# Patient Record
Sex: Male | Born: 1995 | Race: Black or African American | Hispanic: No | Marital: Single | Smoking: Never smoker
Health system: Southern US, Community
[De-identification: ages and names within clinical notes are randomized; demographics above are authoritative.]

## PROBLEM LIST (undated history)

## (undated) DIAGNOSIS — J45909 Unspecified asthma, uncomplicated: Secondary | ICD-10-CM

## (undated) HISTORY — PX: TYMPANOSTOMY TUBE PLACEMENT: SHX32

---

## 2019-10-20 ENCOUNTER — Other Ambulatory Visit: Payer: Self-pay

## 2019-10-20 ENCOUNTER — Emergency Department (HOSPITAL_COMMUNITY): Payer: Self-pay

## 2019-10-20 ENCOUNTER — Encounter (HOSPITAL_COMMUNITY): Payer: Self-pay | Admitting: Emergency Medicine

## 2019-10-20 DIAGNOSIS — F17210 Nicotine dependence, cigarettes, uncomplicated: Secondary | ICD-10-CM | POA: Insufficient documentation

## 2019-10-20 DIAGNOSIS — R072 Precordial pain: Secondary | ICD-10-CM | POA: Insufficient documentation

## 2019-10-20 LAB — TROPONIN I (HIGH SENSITIVITY): Troponin I (High Sensitivity): 2 ng/L (ref <18)

## 2019-10-20 LAB — BASIC METABOLIC PANEL
Anion gap: 9 (ref 5–15)
BUN: 12 mg/dL (ref 6–20)
CO2: 28 mmol/L (ref 22–32)
Calcium: 9.9 mg/dL (ref 8.9–10.3)
Chloride: 102 mmol/L (ref 98–111)
Creatinine, Ser: 1.14 mg/dL (ref 0.61–1.24)
GFR calc Af Amer: >60 mL/min (ref >60)
GFR calc non Af Amer: >60 mL/min (ref >60)
Glucose, Bld: 92 mg/dL (ref 70–99)
Potassium: 3.9 mmol/L (ref 3.5–5.1)
Sodium: 139 mmol/L (ref 135–145)

## 2019-10-20 LAB — CBC
HCT: 48.3 % (ref 39.0–52.0)
Hemoglobin: 16.4 g/dL (ref 13.0–17.0)
MCH: 30.7 pg (ref 26.0–34.0)
MCHC: 34 g/dL (ref 30.0–36.0)
MCV: 90.4 fL (ref 80.0–100.0)
Platelets: 230 10*3/uL (ref 150–400)
RBC: 5.34 MIL/uL (ref 4.22–5.81)
RDW: 11.3 % — ABNORMAL LOW (ref 11.5–15.5)
WBC: 6.1 10*3/uL (ref 4.0–10.5)
nRBC: 0 % (ref 0.0–0.2)

## 2019-10-20 MED ORDER — SODIUM CHLORIDE 0.9% FLUSH: 3.0000 mL | Freq: Once | INTRAVENOUS | Status: DC

## 2019-10-20 NOTE — ED Triage Notes (Signed)
Pt c/o rapid heart started at 6am. Pt felt pain in the center of his chest

## 2019-10-21 ENCOUNTER — Emergency Department (HOSPITAL_COMMUNITY)
Admission: EM | Admit: 2019-10-21 | Discharge: 2019-10-21 | Disposition: A | Discharge status: Home / Self Care | Payer: Self-pay | Attending: Emergency Medicine | Admitting: Emergency Medicine

## 2019-10-21 ENCOUNTER — Encounter (HOSPITAL_COMMUNITY): Payer: Self-pay | Admitting: Emergency Medicine

## 2019-10-21 DIAGNOSIS — R072 Precordial pain: Secondary | ICD-10-CM

## 2019-10-21 MED ORDER — IBUPROFEN 800 MG PO TABS
800.0000 mg | ORAL_TABLET | Freq: Once | ORAL | Status: AC
  Administered 2019-10-21: 800 mg via ORAL
  Filled 2019-10-21: qty 1

## 2019-10-21 MED ORDER — ACETAMINOPHEN 500 MG PO TABS
1000.0000 mg | ORAL_TABLET | Freq: Once | ORAL | Status: AC
  Administered 2019-10-21: 1000 mg via ORAL
  Filled 2019-10-21: qty 2

## 2019-10-21 NOTE — ED Provider Notes (Signed)
[facility]-EMERGENCY DEPT Provider Note   CSN: [id number] Arrival date & time: 10/20/19  2119     History Chief Complaint  Patient presents with  . Tachycardia    Austin Bishop is a 24 y.o. male.  The history is provided by the patient.  Chest Pain Pain location:  Substernal area Pain quality: dull   Pain radiates to:  Does not radiate Pain severity:  Mild Onset quality:  Gradual Duration:  1 day Timing:  Constant Progression:  Unchanged Chronicity:  New Context: at rest   Relieved by:  Nothing Worsened by:  Nothing Ineffective treatments:  None tried Associated symptoms: no abdominal pain, no AICD problem, no altered mental status, no anorexia, no anxiety, no back pain, no claudication, no cough, no diaphoresis, no dizziness, no dysphagia, no fatigue, no fever, no headache, no heartburn, no lower extremity edema, no nausea, no near-syncope, no numbness, no orthopnea, no palpitations, no PND, no shortness of breath, no syncope, no vomiting and no weakness   Associated symptoms comment:  Heart was beating hard  Risk factors: male sex   Risk factors: no aortic disease   Patient admits to doing shots and smoking a pack a day and frequent marijuana use.  No leg pain no leg swelling. No travel.  No exertional symptoms.  No cough,  No f/c/r.       History reviewed. No pertinent past medical history.  There are no problems to display for this patient.   History reviewed. No pertinent surgical history.     History reviewed. No pertinent family history.  Social History   Tobacco Use  . Smoking status: Never Smoker  . Smokeless tobacco: Never Used  Substance Use Topics  . Alcohol use: Never  . Drug use: Never    Home Medications Prior to Admission medications   Not on File    Allergies    Patient has no known allergies.  Review of Systems   Review of Systems  Constitutional: Negative for diaphoresis, fatigue and fever.  HENT: Negative  for congestion and trouble swallowing.   Eyes: Negative for visual disturbance.  Respiratory: Negative for cough and shortness of breath.   Cardiovascular: Positive for chest pain. Negative for palpitations, orthopnea, claudication, leg swelling, syncope, PND and near-syncope.  Gastrointestinal: Negative for abdominal pain, anorexia, heartburn, nausea and vomiting.  Genitourinary: Negative for difficulty urinating.  Musculoskeletal: Negative for back pain.  Skin: Negative for rash.  Neurological: Negative for dizziness, weakness, numbness and headaches.  Psychiatric/Behavioral: Negative for agitation.  All other systems reviewed and are negative.   Physical Exam Updated Vital Signs BP 110/61   Pulse 69   Temp 98.1 F (36.7 C) (Oral)   Resp 20   Ht 5\' 11"  (1.803 m)   Wt 72.6 kg   SpO2 99%   BMI 22.32 kg/m   Physical Exam Vitals and nursing note reviewed.  Constitutional:      General: He is not in acute distress.    Appearance: Normal appearance.  HENT:     Head: Normocephalic and atraumatic.     Nose: Nose normal.  Eyes:     Conjunctiva/sclera: Conjunctivae normal.     Pupils: Pupils are equal, round, and reactive to light.  Cardiovascular:     Rate and Rhythm: Normal rate and regular rhythm.     Pulses: Normal pulses.     Heart sounds: Normal heart sounds.  Pulmonary:     Effort: Pulmonary effort is normal.  Breath sounds: Normal breath sounds.  Abdominal:     General: Abdomen is flat. Bowel sounds are normal.     Tenderness: There is no abdominal tenderness. There is no guarding.  Musculoskeletal:        General: No tenderness. Normal range of motion.     Cervical back: Normal range of motion and neck supple.     Comments: No calf tenderness  Skin:    General: Skin is warm and dry.     Capillary Refill: Capillary refill takes less than 2 seconds.  Neurological:     General: No focal deficit present.     Mental Status: He is alert and oriented to person,  place, and time.  Psychiatric:        Mood and Affect: Mood normal.        Behavior: Behavior normal.     ED Results / Procedures / Treatments   Labs (all labs ordered are listed, but only abnormal results are displayed) Results for orders placed or performed during the hospital encounter of 44/01/02  Basic metabolic panel  Result Value Ref Range   Sodium 139 135 - 145 mmol/L   Potassium 3.9 3.5 - 5.1 mmol/L   Chloride 102 98 - 111 mmol/L   CO2 28 22 - 32 mmol/L   Glucose, Bld 92 70 - 99 mg/dL   BUN 12 6 - 20 mg/dL   Creatinine, Ser 1.14 0.61 - 1.24 mg/dL   Calcium 9.9 8.9 - 10.3 mg/dL   GFR calc non Af Amer >60 >60 mL/min   GFR calc Af Amer >60 >60 mL/min   Anion gap 9 5 - 15  CBC  Result Value Ref Range   WBC 6.1 4.0 - 10.5 K/uL   RBC 5.34 4.22 - 5.81 MIL/uL   Hemoglobin 16.4 13.0 - 17.0 g/dL   HCT 48.3 39.0 - 52.0 %   MCV 90.4 80.0 - 100.0 fL   MCH 30.7 26.0 - 34.0 pg   MCHC 34.0 30.0 - 36.0 g/dL   RDW 11.3 (L) 11.5 - 15.5 %   Platelets 230 150 - 400 K/uL   nRBC 0.0 0.0 - 0.2 %  Troponin I (High Sensitivity)  Result Value Ref Range   Troponin I (High Sensitivity) 2 <18 ng/L   DG Chest 2 View  Result Date: 10/20/2019 CLINICAL DATA:  Tachycardia and chest pain EXAM: CHEST - 2 VIEW COMPARISON:  None. FINDINGS: The heart size and mediastinal contours are within normal limits. Both lungs are clear. The visualized skeletal structures are unremarkable. IMPRESSION: No active cardiopulmonary disease. Electronically Signed   By: Inez Catalina M.D.   On: 10/20/2019 21:55    EKG EKG Interpretation  Date/Time:  Wednesday Bettie Swavely 28 2021 21:45:48 EDT Ventricular Rate:  84 PR Interval:    QRS Duration: 81 QT Interval:  341 QTC Calculation: 403 R Axis:   85 Text Interpretation: Sinus rhythm Right atrial enlargement Confirmed by Dory Horn) on 10/21/2019 12:45:13 AM   Radiology DG Chest 2 View  Result Date: 10/20/2019 CLINICAL DATA:  Tachycardia and chest pain  EXAM: CHEST - 2 VIEW COMPARISON:  None. FINDINGS: The heart size and mediastinal contours are within normal limits. Both lungs are clear. The visualized skeletal structures are unremarkable. IMPRESSION: No active cardiopulmonary disease. Electronically Signed   By: Inez Catalina M.D.   On: 10/20/2019 21:55    Procedures Procedures (including critical care time)  Medications Ordered in ED Medications  sodium chloride flush (NS) 0.9 %  injection 3 mL (has no administration in time range)  acetaminophen (TYLENOL) tablet 1,000 mg (1,000 mg Oral Given 10/21/19 0119)  ibuprofen (ADVIL) tablet 800 mg (800 mg Oral Given 10/21/19 0120)    ED Course  I have reviewed the triage vital signs and the nursing notes.  Pertinent labs & imaging results that were available during my care of the patient were reviewed by me and considered in my medical decision making (see chart for details).   PERC negative wells 0 highly doubt PE in this low risk patient.  Ruled out for MI based on time course.  I have counseled the patient on smoking cessation as well as alcohol and marijuana sensation.  I have encour  Tyrail Grandfield was evaluated in Emergency Department on 10/21/2019 for the symptoms described in the history of present illness. He was evaluated in the context of the global COVID-19 pandemic, which necessitated consideration that the patient might be at risk for infection with the SARS-CoV-2 virus that causes COVID-19. Institutional protocols and algorithms that pertain to the evaluation of patients at risk for COVID-19 are in a state of rapid change based on information released by regulatory bodies including the CDC and federal and state organizations. These policies and algorithms were followed during the patient's care in the ED.  Final Clinical Impression(s) / ED Diagnoses Return for weakness, numbness, changes in vision or speech, fevers >100.4 unrelieved by medication, shortness of breath, intractable  vomiting, or diarrhea, abdominal pain, Inability to tolerate liquids or food, cough, altered mental status or any concerns. No signs of systemic illness or infection. The patient is nontoxic-appearing on exam and vital signs are within normal limits.   I have reviewed the triage vital signs and the nursing notes. Pertinent labs &imaging results that were available during my care of the patient were reviewed by me and considered in my medical decision making (see chart for details).  After history, exam, and medical workup I feel the patient has been appropriately medically screened and is safe for discharge home. Pertinent diagnoses were discussed with the patient. Patient was givenstrictreturn precautions.   Yousra Ivens, MD 10/21/19 8299

## 2020-10-23 ENCOUNTER — Inpatient Hospital Stay (HOSPITAL_COMMUNITY)
Admission: EM | Admit: 2020-10-23 | Discharge: 2020-10-24 | DRG: 956 | Disposition: A | Discharge status: Home / Self Care | Payer: POS | Attending: General Surgery | Admitting: General Surgery

## 2020-10-23 ENCOUNTER — Inpatient Hospital Stay (HOSPITAL_COMMUNITY): Payer: POS | Admitting: Certified Registered Nurse Anesthetist

## 2020-10-23 ENCOUNTER — Encounter (HOSPITAL_COMMUNITY): Payer: Self-pay

## 2020-10-23 ENCOUNTER — Emergency Department (HOSPITAL_COMMUNITY): Payer: POS

## 2020-10-23 ENCOUNTER — Inpatient Hospital Stay (HOSPITAL_COMMUNITY): Payer: POS

## 2020-10-23 ENCOUNTER — Encounter (HOSPITAL_COMMUNITY): Admission: EM | Disposition: A | Discharge status: Home / Self Care | Payer: Self-pay | Source: Home / Self Care

## 2020-10-23 ENCOUNTER — Other Ambulatory Visit: Payer: Self-pay

## 2020-10-23 DIAGNOSIS — S3210XA Unspecified fracture of sacrum, initial encounter for closed fracture: Secondary | ICD-10-CM

## 2020-10-23 DIAGNOSIS — S32009A Unspecified fracture of unspecified lumbar vertebra, initial encounter for closed fracture: Secondary | ICD-10-CM

## 2020-10-23 DIAGNOSIS — S72009A Fracture of unspecified part of neck of unspecified femur, initial encounter for closed fracture: Secondary | ICD-10-CM

## 2020-10-23 DIAGNOSIS — S065X9A Traumatic subdural hemorrhage with loss of consciousness of unspecified duration, initial encounter: Principal | ICD-10-CM

## 2020-10-23 DIAGNOSIS — S32810A Multiple fractures of pelvis with stable disruption of pelvic ring, initial encounter for closed fracture: Secondary | ICD-10-CM

## 2020-10-23 DIAGNOSIS — T1490XA Injury, unspecified, initial encounter: Secondary | ICD-10-CM

## 2020-10-23 DIAGNOSIS — Z419 Encounter for procedure for purposes other than remedying health state, unspecified: Secondary | ICD-10-CM

## 2020-10-23 DIAGNOSIS — S72142A Displaced intertrochanteric fracture of left femur, initial encounter for closed fracture: Secondary | ICD-10-CM

## 2020-10-23 DIAGNOSIS — S22059A Unspecified fracture of T5-T6 vertebra, initial encounter for closed fracture: Secondary | ICD-10-CM | POA: Diagnosis present

## 2020-10-23 DIAGNOSIS — Y92410 Unspecified street and highway as the place of occurrence of the external cause: Secondary | ICD-10-CM | POA: Diagnosis not present

## 2020-10-23 DIAGNOSIS — Z91018 Allergy to other foods: Secondary | ICD-10-CM | POA: Diagnosis not present

## 2020-10-23 DIAGNOSIS — D62 Acute posthemorrhagic anemia: Secondary | ICD-10-CM | POA: Diagnosis not present

## 2020-10-23 DIAGNOSIS — M898X9 Other specified disorders of bone, unspecified site: Secondary | ICD-10-CM | POA: Diagnosis present

## 2020-10-23 DIAGNOSIS — Z91010 Allergy to peanuts: Secondary | ICD-10-CM

## 2020-10-23 DIAGNOSIS — S066X9A Traumatic subarachnoid hemorrhage with loss of consciousness of unspecified duration, initial encounter: Secondary | ICD-10-CM | POA: Diagnosis present

## 2020-10-23 DIAGNOSIS — S32811A Multiple fractures of pelvis with unstable disruption of pelvic ring, initial encounter for closed fracture: Secondary | ICD-10-CM | POA: Diagnosis present

## 2020-10-23 DIAGNOSIS — Z20822 Contact with and (suspected) exposure to covid-19: Secondary | ICD-10-CM | POA: Diagnosis present

## 2020-10-23 DIAGNOSIS — S065XAA Traumatic subdural hemorrhage with loss of consciousness status unknown, initial encounter: Secondary | ICD-10-CM

## 2020-10-23 HISTORY — PX: ORIF PELVIC FRACTURE: SHX2128

## 2020-10-23 HISTORY — PX: INTRAMEDULLARY (IM) NAIL INTERTROCHANTERIC: SHX5875

## 2020-10-23 HISTORY — DX: Unspecified asthma, uncomplicated: J45.909

## 2020-10-23 LAB — I-STAT CHEM 8, ED
BUN: 10 mg/dL (ref 6–20)
Calcium, Ion: 1.15 mmol/L (ref 1.15–1.40)
Chloride: 104 mmol/L (ref 98–111)
Creatinine, Ser: 1.4 mg/dL — ABNORMAL HIGH (ref 0.61–1.24)
Glucose, Bld: 184 mg/dL — ABNORMAL HIGH (ref 70–99)
HCT: 46 % (ref 39.0–52.0)
Hemoglobin: 15.6 g/dL (ref 13.0–17.0)
Potassium: 3.8 mmol/L (ref 3.5–5.1)
Sodium: 140 mmol/L (ref 135–145)
TCO2: 24 mmol/L (ref 22–32)

## 2020-10-23 LAB — CBC
HCT: 48.4 % (ref 39.0–52.0)
Hemoglobin: 16.1 g/dL (ref 13.0–17.0)
MCH: 29.9 pg (ref 26.0–34.0)
MCHC: 33.3 g/dL (ref 30.0–36.0)
MCV: 89.8 fL (ref 80.0–100.0)
Platelets: 267 10*3/uL (ref 150–400)
RBC: 5.39 MIL/uL (ref 4.22–5.81)
RDW: 11.4 % — ABNORMAL LOW (ref 11.5–15.5)
WBC: 7.4 10*3/uL (ref 4.0–10.5)
nRBC: 0 % (ref 0.0–0.2)

## 2020-10-23 LAB — URINALYSIS, ROUTINE W REFLEX MICROSCOPIC
Bilirubin Urine: NEGATIVE
Glucose, UA: 50 mg/dL — AB
Ketones, ur: NEGATIVE mg/dL
Leukocytes,Ua: NEGATIVE
Nitrite: NEGATIVE
Protein, ur: 100 mg/dL — AB
RBC / HPF: >50 RBC/hpf — ABNORMAL HIGH (ref 0–5)
Specific Gravity, Urine: 1.04 — ABNORMAL HIGH (ref 1.005–1.030)
pH: 7 (ref 5.0–8.0)

## 2020-10-23 LAB — PROTIME-INR
INR: 1 (ref 0.8–1.2)
Prothrombin Time: 13.2 seconds (ref 11.4–15.2)

## 2020-10-23 LAB — COMPREHENSIVE METABOLIC PANEL
ALT: 132 U/L — ABNORMAL HIGH (ref 0–44)
AST: 175 U/L — ABNORMAL HIGH (ref 15–41)
Albumin: 4.2 g/dL (ref 3.5–5.0)
Alkaline Phosphatase: 40 U/L (ref 38–126)
Anion gap: 11 (ref 5–15)
BUN: 10 mg/dL (ref 6–20)
CO2: 23 mmol/L (ref 22–32)
Calcium: 9.6 mg/dL (ref 8.9–10.3)
Chloride: 104 mmol/L (ref 98–111)
Creatinine, Ser: 1.42 mg/dL — ABNORMAL HIGH (ref 0.61–1.24)
GFR, Estimated: >60 mL/min (ref >60)
Glucose, Bld: 189 mg/dL — ABNORMAL HIGH (ref 70–99)
Potassium: 3.7 mmol/L (ref 3.5–5.1)
Sodium: 138 mmol/L (ref 135–145)
Total Bilirubin: 0.9 mg/dL (ref 0.3–1.2)
Total Protein: 7.1 g/dL (ref 6.5–8.1)

## 2020-10-23 LAB — LACTIC ACID, PLASMA: Lactic Acid, Venous: 2.4 mmol/L (ref 0.5–1.9)

## 2020-10-23 LAB — SURGICAL PCR SCREEN
MRSA, PCR: NEGATIVE
Staphylococcus aureus: POSITIVE — AB

## 2020-10-23 LAB — SAMPLE TO BLOOD BANK

## 2020-10-23 LAB — RESP PANEL BY RT-PCR (FLU A&B, COVID) ARPGX2
Influenza A by PCR: NEGATIVE
Influenza B by PCR: NEGATIVE
SARS Coronavirus 2 by RT PCR: NEGATIVE

## 2020-10-23 LAB — TYPE AND SCREEN
ABO/RH(D): B POS
Antibody Screen: NEGATIVE

## 2020-10-23 LAB — HIV ANTIBODY (ROUTINE TESTING W REFLEX): HIV Screen 4th Generation wRfx: NONREACTIVE

## 2020-10-23 LAB — ETHANOL: Alcohol, Ethyl (B): <10 mg/dL (ref <10)

## 2020-10-23 SURGERY — OPEN REDUCTION INTERNAL FIXATION (ORIF) PELVIC FRACTURE
Anesthesia: General | Site: Pelvis | Laterality: Left

## 2020-10-23 MED ORDER — PROMETHAZINE HCL 25 MG/ML IJ SOLN: 6.2500 mg | INTRAMUSCULAR | Status: DC | PRN

## 2020-10-23 MED ORDER — LACTATED RINGERS IV SOLN: INTRAVENOUS | Status: DC | PRN

## 2020-10-23 MED ORDER — OXYCODONE HCL 5 MG PO TABS: 5.0000 mg | ORAL_TABLET | Freq: Once | ORAL | Status: DC | PRN

## 2020-10-23 MED ORDER — HYDROMORPHONE HCL 1 MG/ML IJ SOLN
1.0000 mg | INTRAMUSCULAR | Status: DC | PRN
  Administered 2020-10-23 (×2): 1 mg via INTRAVENOUS
  Filled 2020-10-23 (×2): qty 1

## 2020-10-23 MED ORDER — OXYCODONE HCL 5 MG/5ML PO SOLN: 5.0000 mg | Freq: Once | ORAL | Status: DC | PRN

## 2020-10-23 MED ORDER — OXYCODONE HCL 5 MG PO TABS
5.0000 mg | ORAL_TABLET | ORAL | Status: DC | PRN
Start: 2020-10-23 — End: 2020-10-24

## 2020-10-23 MED ORDER — SUFENTANIL CITRATE 50 MCG/ML IV SOLN
INTRAVENOUS | Status: AC
  Filled 2020-10-23: qty 1

## 2020-10-23 MED ORDER — CEFAZOLIN SODIUM-DEXTROSE 2-4 GM/100ML-% IV SOLN
INTRAVENOUS | Status: AC
  Filled 2020-10-23: qty 100

## 2020-10-23 MED ORDER — ONDANSETRON HCL 4 MG/2ML IJ SOLN
INTRAMUSCULAR | Status: AC
  Filled 2020-10-23: qty 2

## 2020-10-23 MED ORDER — FENTANYL CITRATE (PF) 100 MCG/2ML IJ SOLN
INTRAMUSCULAR | Status: AC
  Filled 2020-10-23: qty 2

## 2020-10-23 MED ORDER — 0.9 % SODIUM CHLORIDE (POUR BTL) OPTIME
TOPICAL | Status: DC | PRN
  Administered 2020-10-23: 1000 mL

## 2020-10-23 MED ORDER — OXYCODONE HCL 5 MG PO TABS
10.0000 mg | ORAL_TABLET | ORAL | Status: DC | PRN
  Administered 2020-10-24 (×3): 10 mg via ORAL
  Filled 2020-10-23 (×3): qty 2

## 2020-10-23 MED ORDER — METOPROLOL TARTRATE 5 MG/5ML IV SOLN: 5.0000 mg | Freq: Four times a day (QID) | INTRAVENOUS | Status: DC | PRN

## 2020-10-23 MED ORDER — CHLORHEXIDINE GLUCONATE 0.12 % MT SOLN
OROMUCOSAL | Status: AC
  Administered 2020-10-23: 15 mL
  Filled 2020-10-23: qty 15

## 2020-10-23 MED ORDER — FENTANYL CITRATE (PF) 250 MCG/5ML IJ SOLN
INTRAMUSCULAR | Status: DC | PRN
  Administered 2020-10-23: 50 ug via INTRAVENOUS
  Administered 2020-10-23 (×2): 100 ug via INTRAVENOUS

## 2020-10-23 MED ORDER — DOCUSATE SODIUM 100 MG PO CAPS: 100.0000 mg | ORAL_CAPSULE | Freq: Two times a day (BID) | ORAL | Status: DC

## 2020-10-23 MED ORDER — SUFENTANIL CITRATE 50 MCG/ML IV SOLN
INTRAVENOUS | Status: DC | PRN
  Administered 2020-10-23: 10 ug via INTRAVENOUS

## 2020-10-23 MED ORDER — MUPIROCIN 2 % EX OINT
1.0000 "application " | TOPICAL_OINTMENT | Freq: Two times a day (BID) | CUTANEOUS | Status: DC
  Administered 2020-10-23 – 2020-10-24 (×2): 1 via NASAL
  Filled 2020-10-23: qty 22

## 2020-10-23 MED ORDER — CEFAZOLIN SODIUM-DEXTROSE 2-4 GM/100ML-% IV SOLN
2.0000 g | INTRAVENOUS | Status: AC
  Administered 2020-10-23: 2 g via INTRAVENOUS

## 2020-10-23 MED ORDER — PROPOFOL 10 MG/ML IV BOLUS
INTRAVENOUS | Status: DC | PRN
  Administered 2020-10-23: 150 mg via INTRAVENOUS

## 2020-10-23 MED ORDER — DEXAMETHASONE SODIUM PHOSPHATE 10 MG/ML IJ SOLN
INTRAMUSCULAR | Status: DC | PRN
  Administered 2020-10-23: 10 mg via INTRAVENOUS

## 2020-10-23 MED ORDER — LEVETIRACETAM IN NACL 500 MG/100ML IV SOLN
500.0000 mg | Freq: Two times a day (BID) | INTRAVENOUS | Status: DC
Start: 2020-10-23 — End: 2020-10-24
  Administered 2020-10-23 – 2020-10-24 (×2): 500 mg via INTRAVENOUS
  Filled 2020-10-23 (×2): qty 100

## 2020-10-23 MED ORDER — CHLORHEXIDINE GLUCONATE 4 % EX LIQD
60.0000 mL | Freq: Once | CUTANEOUS | Status: DC
  Filled 2020-10-23: qty 60

## 2020-10-23 MED ORDER — FENTANYL CITRATE (PF) 100 MCG/2ML IJ SOLN
INTRAMUSCULAR | Status: AC | PRN
  Administered 2020-10-23 (×2): 50 ug via INTRAVENOUS

## 2020-10-23 MED ORDER — ACETAMINOPHEN 10 MG/ML IV SOLN
INTRAVENOUS | Status: DC | PRN
  Administered 2020-10-23: 1000 mg via INTRAVENOUS

## 2020-10-23 MED ORDER — IOHEXOL 300 MG/ML  SOLN
100.0000 mL | Freq: Once | INTRAMUSCULAR | Status: AC | PRN
  Administered 2020-10-23: 90 mL via INTRAVENOUS

## 2020-10-23 MED ORDER — LEVETIRACETAM IN NACL 1000 MG/100ML IV SOLN
1000.0000 mg | Freq: Once | INTRAVENOUS | Status: AC
  Administered 2020-10-23: 1000 mg via INTRAVENOUS
  Filled 2020-10-23: qty 100

## 2020-10-23 MED ORDER — PROPOFOL 10 MG/ML IV BOLUS
INTRAVENOUS | Status: AC
  Filled 2020-10-23: qty 20

## 2020-10-23 MED ORDER — MUPIROCIN 2 % EX OINT: 1.0000 "application " | TOPICAL_OINTMENT | Freq: Two times a day (BID) | CUTANEOUS | Status: DC

## 2020-10-23 MED ORDER — KCL IN DEXTROSE-NACL 20-5-0.45 MEQ/L-%-% IV SOLN
INTRAVENOUS | Status: DC
  Filled 2020-10-23 (×2): qty 1000

## 2020-10-23 MED ORDER — ROCURONIUM BROMIDE 10 MG/ML (PF) SYRINGE
PREFILLED_SYRINGE | INTRAVENOUS | Status: AC
  Filled 2020-10-23: qty 10

## 2020-10-23 MED ORDER — MORPHINE SULFATE (PF) 2 MG/ML IV SOLN
1.0000 mg | INTRAVENOUS | Status: DC | PRN
  Administered 2020-10-23 – 2020-10-24 (×3): 2 mg via INTRAVENOUS
  Filled 2020-10-23 (×3): qty 1

## 2020-10-23 MED ORDER — LIDOCAINE 2% (20 MG/ML) 5 ML SYRINGE
INTRAMUSCULAR | Status: DC | PRN
  Administered 2020-10-23: 100 mg via INTRAVENOUS

## 2020-10-23 MED ORDER — DEXMEDETOMIDINE (PRECEDEX) IN NS 20 MCG/5ML (4 MCG/ML) IV SYRINGE
PREFILLED_SYRINGE | INTRAVENOUS | Status: DC | PRN
  Administered 2020-10-23 (×2): 10 ug via INTRAVENOUS

## 2020-10-23 MED ORDER — ROCURONIUM BROMIDE 100 MG/10ML IV SOLN
INTRAVENOUS | Status: DC | PRN
  Administered 2020-10-23 (×2): 50 mg via INTRAVENOUS

## 2020-10-23 MED ORDER — DEXAMETHASONE SODIUM PHOSPHATE 10 MG/ML IJ SOLN
INTRAMUSCULAR | Status: AC
  Filled 2020-10-23: qty 1

## 2020-10-23 MED ORDER — MIDAZOLAM HCL 2 MG/2ML IJ SOLN
INTRAMUSCULAR | Status: AC
  Filled 2020-10-23: qty 2

## 2020-10-23 MED ORDER — ACETAMINOPHEN 500 MG PO TABS
1000.0000 mg | ORAL_TABLET | Freq: Four times a day (QID) | ORAL | Status: DC
  Administered 2020-10-23 – 2020-10-24 (×3): 1000 mg via ORAL
  Filled 2020-10-23 (×3): qty 2

## 2020-10-23 MED ORDER — ONDANSETRON 4 MG PO TBDP: 4.0000 mg | ORAL_TABLET | Freq: Four times a day (QID) | ORAL | Status: DC | PRN

## 2020-10-23 MED ORDER — FENTANYL CITRATE (PF) 250 MCG/5ML IJ SOLN
INTRAMUSCULAR | Status: AC
  Filled 2020-10-23: qty 5

## 2020-10-23 MED ORDER — ONDANSETRON HCL 4 MG/2ML IJ SOLN
INTRAMUSCULAR | Status: DC | PRN
  Administered 2020-10-23: 4 mg via INTRAVENOUS

## 2020-10-23 MED ORDER — ONDANSETRON HCL 4 MG/2ML IJ SOLN: 4.0000 mg | Freq: Four times a day (QID) | INTRAMUSCULAR | Status: DC | PRN

## 2020-10-23 MED ORDER — SUGAMMADEX SODIUM 200 MG/2ML IV SOLN
INTRAVENOUS | Status: DC | PRN
  Administered 2020-10-23: 200 mg via INTRAVENOUS

## 2020-10-23 MED ORDER — POVIDONE-IODINE 10 % EX SWAB
2.0000 "application " | Freq: Once | CUTANEOUS | Status: AC
  Administered 2020-10-23: 2 via TOPICAL

## 2020-10-23 MED ORDER — FENTANYL CITRATE (PF) 100 MCG/2ML IJ SOLN
25.0000 ug | INTRAMUSCULAR | Status: DC | PRN
  Administered 2020-10-23: 50 ug via INTRAVENOUS

## 2020-10-23 MED ORDER — CEFAZOLIN SODIUM-DEXTROSE 2-4 GM/100ML-% IV SOLN
2.0000 g | Freq: Three times a day (TID) | INTRAVENOUS | Status: AC
  Administered 2020-10-23 – 2020-10-24 (×3): 2 g via INTRAVENOUS
  Filled 2020-10-23 (×3): qty 100

## 2020-10-23 MED ORDER — ACETAMINOPHEN 10 MG/ML IV SOLN
INTRAVENOUS | Status: AC
  Filled 2020-10-23: qty 100

## 2020-10-23 MED ORDER — LIDOCAINE 2% (20 MG/ML) 5 ML SYRINGE
INTRAMUSCULAR | Status: AC
  Filled 2020-10-23: qty 5

## 2020-10-23 MED ORDER — CHLORHEXIDINE GLUCONATE CLOTH 2 % EX PADS
6.0000 | MEDICATED_PAD | Freq: Every day | CUTANEOUS | Status: DC
  Administered 2020-10-23: 6 via TOPICAL

## 2020-10-23 SURGICAL SUPPLY — 66 items
BIT DRILL 4.8X300 (BIT) ×3 IMPLANT
BLADE CLIPPER SURG (BLADE) IMPLANT
BNDG COHESIVE 6X5 TAN STRL LF (GAUZE/BANDAGES/DRESSINGS) IMPLANT
BRUSH SCRUB EZ PLAIN DRY (MISCELLANEOUS) ×6 IMPLANT
COVER PERINEAL POST (MISCELLANEOUS) ×3 IMPLANT
COVER SURGICAL LIGHT HANDLE (MISCELLANEOUS) ×6 IMPLANT
COVER WAND RF STERILE (DRAPES) ×3 IMPLANT
DRAIN CHANNEL 15F RND FF W/TCR (WOUND CARE) IMPLANT
DRAPE C-ARM 42X72 X-RAY (DRAPES) ×3 IMPLANT
DRAPE C-ARMOR (DRAPES) ×3 IMPLANT
DRAPE HALF SHEET 40X57 (DRAPES) IMPLANT
DRAPE LAPAROTOMY TRNSV 102X78 (DRAPES) ×3 IMPLANT
DRAPE ORTHO SPLIT 77X108 STRL (DRAPES)
DRAPE STERI IOBAN 125X83 (DRAPES) ×3 IMPLANT
DRAPE SURG 17X23 STRL (DRAPES) ×3 IMPLANT
DRAPE SURG ORHT 6 SPLT 77X108 (DRAPES) IMPLANT
DRAPE U-SHAPE 47X51 STRL (DRAPES) ×3 IMPLANT
DRESSING MEPILEX FLEX 4X4 (GAUZE/BANDAGES/DRESSINGS) ×2 IMPLANT
DRSG EMULSION OIL 3X3 NADH (GAUZE/BANDAGES/DRESSINGS) ×3 IMPLANT
DRSG MEPILEX BORDER 4X4 (GAUZE/BANDAGES/DRESSINGS) ×3 IMPLANT
DRSG MEPILEX BORDER 4X8 (GAUZE/BANDAGES/DRESSINGS) ×3 IMPLANT
DRSG MEPILEX FLEX 4X4 (GAUZE/BANDAGES/DRESSINGS) ×3
ELECT REM PT RETURN 9FT ADLT (ELECTROSURGICAL) ×3
ELECTRODE REM PT RTRN 9FT ADLT (ELECTROSURGICAL) ×2 IMPLANT
EVACUATOR SILICONE 100CC (DRAIN) IMPLANT
GLOVE BIO SURGEON STRL SZ7.5 (GLOVE) ×3 IMPLANT
GLOVE BIO SURGEON STRL SZ8 (GLOVE) ×3 IMPLANT
GLOVE BIOGEL PI IND STRL 7.5 (GLOVE) ×2 IMPLANT
GLOVE BIOGEL PI INDICATOR 7.5 (GLOVE) ×1
GLOVE SRG 8 PF TXTR STRL LF DI (GLOVE) ×2 IMPLANT
GLOVE SURG UNDER POLY LF SZ8 (GLOVE) ×1
GOWN STRL REUS W/ TWL LRG LVL3 (GOWN DISPOSABLE) ×4 IMPLANT
GOWN STRL REUS W/ TWL XL LVL3 (GOWN DISPOSABLE) ×2 IMPLANT
GOWN STRL REUS W/TWL LRG LVL3 (GOWN DISPOSABLE) ×2
GOWN STRL REUS W/TWL XL LVL3 (GOWN DISPOSABLE) ×1
KIT BASIN OR (CUSTOM PROCEDURE TRAY) ×3 IMPLANT
KIT TURNOVER KIT B (KITS) ×3 IMPLANT
MANIFOLD NEPTUNE II (INSTRUMENTS) ×3 IMPLANT
NS IRRIG 1000ML POUR BTL (IV SOLUTION) ×6 IMPLANT
PACK GENERAL/GYN (CUSTOM PROCEDURE TRAY) ×3 IMPLANT
PACK TOTAL JOINT (CUSTOM PROCEDURE TRAY) ×3 IMPLANT
PAD ARMBOARD 7.5X6 YLW CONV (MISCELLANEOUS) ×6 IMPLANT
PIN GUIDE DRILL TIP 2.8X300 (DRILL) ×12 IMPLANT
SCREW CANN 8X140X40 (Screw) ×3 IMPLANT
SCREW CANN 8X90X16 (Screw) ×6 IMPLANT
SCREW LOCK 8X85X16 (Screw) ×3 IMPLANT
SPONGE LAP 18X18 RF (DISPOSABLE) IMPLANT
STAPLER VISISTAT 35W (STAPLE) ×3 IMPLANT
STOCKINETTE IMPERVIOUS LG (DRAPES) IMPLANT
SUCTION FRAZIER HANDLE 10FR (MISCELLANEOUS) ×1
SUCTION TUBE FRAZIER 10FR DISP (MISCELLANEOUS) ×2 IMPLANT
SUT ETHILON 2 0 FS 18 (SUTURE) ×3 IMPLANT
SUT ETHILON 3 0 PS 1 (SUTURE) ×3 IMPLANT
SUT VIC AB 0 CT1 27 (SUTURE) ×2
SUT VIC AB 0 CT1 27XBRD ANBCTR (SUTURE) ×4 IMPLANT
SUT VIC AB 1 CT1 18XCR BRD 8 (SUTURE) ×4 IMPLANT
SUT VIC AB 1 CT1 27 (SUTURE) ×2
SUT VIC AB 1 CT1 27XBRD ANBCTR (SUTURE) ×4 IMPLANT
SUT VIC AB 1 CT1 8-18 (SUTURE) ×2
SUT VIC AB 2-0 CT1 27 (SUTURE) ×3
SUT VIC AB 2-0 CT1 TAPERPNT 27 (SUTURE) ×6 IMPLANT
TOWEL GREEN STERILE (TOWEL DISPOSABLE) ×6 IMPLANT
TOWEL GREEN STERILE FF (TOWEL DISPOSABLE) ×3 IMPLANT
TRAY FOLEY MTR SLVR 16FR STAT (SET/KITS/TRAYS/PACK) IMPLANT
WASHER FLAG 8.0 STRL (Washer) ×3 IMPLANT
WATER STERILE IRR 1000ML POUR (IV SOLUTION) ×12 IMPLANT

## 2020-10-23 NOTE — ED Notes (Signed)
Lab called with critical lab, lactic 2.4. Primary RN Ivory Broad and MD Anitra Lauth.notified.

## 2020-10-23 NOTE — Anesthesia Preprocedure Evaluation (Signed)
Anesthesia Evaluation  Patient identified by MRN, date of birth, ID band Patient awake    Reviewed: Allergy & Precautions, NPO status , Patient's Chart, lab work & pertinent test results  History of Anesthesia Complications Negative for: history of anesthetic complications  Airway Mallampati: II  TM Distance: >3 FB Neck ROM: Full    Dental   Pulmonary asthma ,    Pulmonary exam normal        Cardiovascular negative cardio ROS Normal cardiovascular exam     Neuro/Psych negative neurological ROS  negative psych ROS   GI/Hepatic negative GI ROS, (+)     substance abuse  marijuana use,   Endo/Other  negative endocrine ROS  Renal/GU ARFRenal disease (Cr 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  Reproductive/Obstetrics negative OB ROS                             Anesthesia Physical Anesthesia Plan  ASA: II  Anesthesia Plan: General   Post-op Pain Management:    Induction: Intravenous  PONV Risk Score and Plan: 3 and Treatment may vary due to age or medical condition, Ondansetron, Dexamethasone and Midazolam  Airway Management Planned: Oral ETT  Additional Equipment: None  Intra-op Plan:   Post-operative Plan: Extubation in OR  Informed Consent: I have reviewed the patients History and Physical, chart, labs and discussed the procedure including the risks, benefits and alternatives for the proposed anesthesia with the patient or authorized representative who has indicated his/her understanding and acceptance.     Dental advisory given  Plan Discussed with: CRNA  Anesthesia Plan Comments:         Anesthesia Quick Evaluation
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
## 2020-10-23 NOTE — Transfer of Care (Signed)
Immediate Anesthesia Transfer of Care Note  Patient: Austin Bishop  Procedure(s) Performed: OPEN REDUCTION INTERNAL FIXATION (ORIF) PELVIC FRACTURE (Left Pelvis) INTRAMEDULLARY (IM) NAIL INTERTROCHANTRIC (Left Hip)  Patient Location: PACU  Anesthesia Type:General  Level of Consciousness: drowsy, patient cooperative and responds to stimulation  Airway & Oxygen Therapy: Patient Spontanous Breathing and Patient connected to nasal cannula oxygen  Post-op Assessment: Report given to RN, Post -op Vital signs reviewed and stable and Patient moving all extremities X 4  Post vital signs: Reviewed and stable  Last Vitals:  Vitals Value Taken Time  BP 126/69 10/23/20 1825  Temp 38.1 C 10/23/20 1825  Pulse 84 10/23/20 1831  Resp 19 10/23/20 1831  SpO2 100 % 10/23/20 1831  Vitals shown include unvalidated device data.  Last Pain:  Vitals:   10/23/20 1342  TempSrc: Temporal  PainSc: 5          Complications: No complications documented.
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
## 2020-10-23 NOTE — ED Notes (Signed)
Mother Aaron Mose called at patient request [phone] patient update given

## 2020-10-23 NOTE — Anesthesia Procedure Notes (Signed)
Procedure Name: Intubation Date/Time: 10/23/2020 3:53 PM Performed by: Audie Pinto, CRNA Pre-anesthesia Checklist: Patient identified, Emergency Drugs available, Suction available and Patient being monitored Patient Re-evaluated:Patient Re-evaluated prior to induction Oxygen Delivery Method: Circle system utilized Preoxygenation: Pre-oxygenation with 100% oxygen Induction Type: IV induction Ventilation: Mask ventilation without difficulty Laryngoscope Size: Glidescope and 4 Grade View: Grade I Tube type: Oral Tube size: 7.5 mm Number of attempts: 1 Airway Equipment and Method: Stylet and Oral airway Placement Confirmation: ETT inserted through vocal cords under direct vision,  positive ETCO2 and breath sounds checked- equal and bilateral Secured at: 22 cm Tube secured with: Tape Dental Injury: Teeth and Oropharynx as per pre-operative assessment  Comments: Intubation by SRNA. Elective glidescope, trauma pt
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
## 2020-10-23 NOTE — ED Notes (Signed)
Patient transported to CT, scanner 3 with Trauma RN Clydie Braun & NT Ivey at bedside.
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
## 2020-10-23 NOTE — TOC CAGE-AID Note (Signed)
Transition of Care [facility]) - CAGE-AID Screening   Patient Details  Name: Austin Bishop MRN: [id number] Date of Birth: 02/09/1996      Hewitt Shorts, RN Trauma Response Nurse Phone Number: 10/23/2020, 1:38 PM   Clinical Narrative:    Admits to using marijuana daily, denies any other street drugs or alcohol use. Does not want any information regarding substance abuse   CAGE-AID Screening:    Have You Ever Felt You Ought to Cut Down on Your Drinking or Drug Use?: No Have People Annoyed You By Critizing Your Drinking Or Drug Use?: No Have You Felt Bad Or Guilty About Your Drinking Or Drug Use?: No Have You Ever Had a Drink or Used Drugs First Thing In The Morning to Steady Your Nerves or to Get Rid of a Hangover?: No CAGE-AID Score: 0  Substance Abuse Education Offered: No (Denies drinking alcohol, admits to marijuana use daily- does not want information.)
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
## 2020-10-24 LAB — ABO/RH: ABO/RH(D): B POS

## 2020-10-24 LAB — BASIC METABOLIC PANEL
Anion gap: 10 (ref 5–15)
BUN: 10 mg/dL (ref 6–20)
CO2: 24 mmol/L (ref 22–32)
Calcium: 9.3 mg/dL (ref 8.9–10.3)
Chloride: 106 mmol/L (ref 98–111)
Creatinine, Ser: 1.31 mg/dL — ABNORMAL HIGH (ref 0.61–1.24)
GFR, Estimated: >60 mL/min (ref >60)
Glucose, Bld: 136 mg/dL — ABNORMAL HIGH (ref 70–99)
Potassium: 4.1 mmol/L (ref 3.5–5.1)
Sodium: 140 mmol/L (ref 135–145)

## 2020-10-24 LAB — CBC
HCT: 39.9 % (ref 39.0–52.0)
Hemoglobin: 13.8 g/dL (ref 13.0–17.0)
MCH: 30.5 pg (ref 26.0–34.0)
MCHC: 34.6 g/dL (ref 30.0–36.0)
MCV: 88.3 fL (ref 80.0–100.0)
Platelets: 195 10*3/uL (ref 150–400)
RBC: 4.52 MIL/uL (ref 4.22–5.81)
RDW: 11.4 % — ABNORMAL LOW (ref 11.5–15.5)
WBC: 7.7 10*3/uL (ref 4.0–10.5)
nRBC: 0 % (ref 0.0–0.2)

## 2020-10-24 LAB — VITAMIN D 25 HYDROXY (VIT D DEFICIENCY, FRACTURES): Vit D, 25-Hydroxy: 7 ng/mL — ABNORMAL LOW (ref 30–100)

## 2020-10-24 MED ORDER — ENOXAPARIN SODIUM 40 MG/0.4ML IJ SOSY: 40.0000 mg | PREFILLED_SYRINGE | INTRAMUSCULAR | 0 refills | Status: AC

## 2020-10-24 MED ORDER — ASCORBIC ACID 500 MG PO TABS
1000.0000 mg | ORAL_TABLET | Freq: Every day | ORAL | Status: DC
  Administered 2020-10-24: 1000 mg via ORAL
  Filled 2020-10-24: qty 2

## 2020-10-24 MED ORDER — METHOCARBAMOL 750 MG PO TABS: 750.0000 mg | ORAL_TABLET | Freq: Three times a day (TID) | ORAL | 2 refills | Status: AC

## 2020-10-24 MED ORDER — OXYCODONE HCL 10 MG PO TABS: 10.0000 mg | ORAL_TABLET | Freq: Four times a day (QID) | ORAL | 0 refills | Status: AC | PRN

## 2020-10-24 MED ORDER — VITAMIN D 25 MCG (1000 UNIT) PO TABS
2000.0000 [IU] | ORAL_TABLET | Freq: Two times a day (BID) | ORAL | Status: DC
  Administered 2020-10-24: 2000 [IU] via ORAL
  Filled 2020-10-24: qty 2

## 2020-10-24 MED ORDER — ACETAMINOPHEN 500 MG PO TABS: 500.0000 mg | ORAL_TABLET | Freq: Four times a day (QID) | ORAL | 0 refills | Status: AC

## 2020-10-24 NOTE — Anesthesia Postprocedure Evaluation (Signed)
Anesthesia Post Note  Patient: Austin Bishop  Procedure(s) Performed: OPEN REDUCTION INTERNAL FIXATION (ORIF) PELVIC FRACTURE (Left Pelvis) INTRAMEDULLARY (IM) NAIL INTERTROCHANTRIC (Left Hip)     Patient location during evaluation: PACU Anesthesia Type: General Level of consciousness: awake and alert Pain management: pain level controlled Vital Signs Assessment: post-procedure vital signs reviewed and stable Respiratory status: spontaneous breathing, nonlabored ventilation and respiratory function stable Cardiovascular status: blood pressure returned to baseline and stable Postop Assessment: no apparent nausea or vomiting Anesthetic complications: no   No complications documented.  Last Vitals:  Vitals:   10/24/20 1200 10/24/20 1225  BP:  131/84  Pulse:  75  Resp:  20  Temp: 37.4 C   SpO2:  98%    Last Pain:  Vitals:   10/24/20 1402  TempSrc:   PainSc: 3                  Beryle Lathe
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
## 2020-10-24 NOTE — Progress Notes (Signed)
[employer] at bedside. IV removed. Discharge education provided to pt. He has no questions. Mother is at the bedside. Paperwork given to [employer]. Follow up appt scheduled in 2 weeks. [employer] aware

## 2020-10-24 NOTE — Progress Notes (Signed)
OT Cancellation Note  Patient Details Name: Austin Bishop MRN: [id number] DOB: [birth year]   Cancelled Treatment:    Reason Eval/Treat Not Completed: Active bedrest order OT order received and appreciated however this conflicts with current bedrest order set. Please increase activity tolerance as appropriate and remove bedrest from orders. . Please contact OT at [phone] if bed rest order is discontinued. OT will hold evaluation at this time and will check back as time allows pending increased activity orders.   Wynona Neat, OTR/L  Acute Rehabilitation Services Pager: [phone] Office: [phone] .  10/24/2020, 8:55 AM

## 2020-10-24 NOTE — Progress Notes (Signed)
Orthopedic Tech Progress Note Patient Details:  Austin Bishop 1995/12/05 [id number]  Ortho Devices Type of Ortho Device: Crutches Ortho Device/Splint Interventions: Ordered,Adjustment   Post Interventions Patient Tolerated: Well Instructions Provided: Care of device   Donald Pore 10/24/2020, 12:07 PM
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
## 2020-10-24 NOTE — Progress Notes (Addendum)
   Providing Compassionate, Quality Care - Together  NEUROSURGERY PROGRESS NOTE   S: No issues overnight. No HA this am, denies sz activity  O: EXAM:  BP 109/62   Pulse 85   Temp 100 F (37.8 C) (Oral)   Resp 20   Ht 5\' 1"  (1.549 m)   Wt 68 kg   SpO2 92%   BMI 28.34 kg/m   Awake, alert, oriented x3 PERRL EOMI Speech fluent, appropriate  CNs grossly intact  MAE equally SILT  ASSESSMENT:  25 y.o. male with   1. 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  Please do not hesitate to call with questions or concerns.   [facility], DO Neurosurgeon [facility] Cell: [phone]

## 2020-10-25 ENCOUNTER — Encounter (HOSPITAL_COMMUNITY): Payer: Self-pay | Admitting: Orthopedic Surgery

## 2020-10-28 LAB — CDS SEROLOGY

## 2020-11-16 ENCOUNTER — Encounter (HOSPITAL_COMMUNITY): Payer: Self-pay | Admitting: Orthopedic Surgery

## 2021-01-03 ENCOUNTER — Encounter: Payer: POS | Admitting: Physical Medicine & Rehabilitation
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
